# Patient Record
Sex: Female | Born: 1955 | Race: Black or African American | Hispanic: No | Smoking: Former smoker
Health system: Southern US, Community
[De-identification: ages and names within clinical notes are randomized; demographics above are authoritative.]

## PROBLEM LIST (undated history)

## (undated) DIAGNOSIS — E7800 Pure hypercholesterolemia, unspecified: Secondary | ICD-10-CM

## (undated) DIAGNOSIS — H269 Unspecified cataract: Secondary | ICD-10-CM

## (undated) DIAGNOSIS — M1990 Unspecified osteoarthritis, unspecified site: Secondary | ICD-10-CM

## (undated) DIAGNOSIS — G510 Bell's palsy: Secondary | ICD-10-CM

## (undated) DIAGNOSIS — I10 Essential (primary) hypertension: Secondary | ICD-10-CM

## (undated) HISTORY — DX: Unspecified cataract: H26.9

## (undated) HISTORY — PX: CHOLECYSTECTOMY: SHX55

## (undated) HISTORY — DX: Unspecified osteoarthritis, unspecified site: M19.90

---

## 2004-11-04 ENCOUNTER — Emergency Department (HOSPITAL_COMMUNITY): Admission: EM | Admit: 2004-11-04 | Discharge: 2004-11-04 | Payer: Self-pay | Admitting: *Deleted

## 2005-12-17 ENCOUNTER — Ambulatory Visit: Payer: Self-pay | Admitting: Nurse Practitioner

## 2007-01-21 ENCOUNTER — Ambulatory Visit: Payer: Self-pay | Admitting: *Deleted

## 2008-02-01 ENCOUNTER — Ambulatory Visit: Payer: Self-pay | Admitting: Family Medicine

## 2008-02-21 ENCOUNTER — Ambulatory Visit: Payer: Self-pay | Admitting: Gastroenterology

## 2009-04-18 ENCOUNTER — Ambulatory Visit: Payer: Self-pay

## 2011-03-20 ENCOUNTER — Ambulatory Visit: Payer: Self-pay | Admitting: Family Medicine

## 2011-04-07 ENCOUNTER — Emergency Department (HOSPITAL_COMMUNITY): Payer: No Typology Code available for payment source

## 2011-04-07 ENCOUNTER — Emergency Department (HOSPITAL_COMMUNITY)
Admission: EM | Admit: 2011-04-07 | Discharge: 2011-04-07 | Disposition: A | Discharge status: Home / Self Care | Payer: No Typology Code available for payment source | Attending: Emergency Medicine | Admitting: Emergency Medicine

## 2011-04-07 DIAGNOSIS — M545 Low back pain, unspecified: Secondary | ICD-10-CM | POA: Insufficient documentation

## 2011-04-07 DIAGNOSIS — Y92410 Unspecified street and highway as the place of occurrence of the external cause: Secondary | ICD-10-CM | POA: Insufficient documentation

## 2011-04-07 DIAGNOSIS — S39012A Strain of muscle, fascia and tendon of lower back, initial encounter: Secondary | ICD-10-CM

## 2011-04-07 DIAGNOSIS — R079 Chest pain, unspecified: Secondary | ICD-10-CM | POA: Insufficient documentation

## 2011-04-07 DIAGNOSIS — R209 Unspecified disturbances of skin sensation: Secondary | ICD-10-CM | POA: Insufficient documentation

## 2011-04-07 DIAGNOSIS — I10 Essential (primary) hypertension: Secondary | ICD-10-CM | POA: Insufficient documentation

## 2011-04-07 DIAGNOSIS — S335XXA Sprain of ligaments of lumbar spine, initial encounter: Secondary | ICD-10-CM | POA: Insufficient documentation

## 2011-04-07 HISTORY — DX: Essential (primary) hypertension: I10

## 2011-04-07 MED ORDER — NAPROXEN 500 MG PO TABS: 500.0000 mg | ORAL_TABLET | Freq: Two times a day (BID) | ORAL | Status: DC

## 2011-04-07 MED ORDER — HYDROCODONE-ACETAMINOPHEN 5-325 MG PO TABS: 1.0000 | ORAL_TABLET | Freq: Four times a day (QID) | ORAL | Status: AC | PRN
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
## 2011-04-07 NOTE — ED Notes (Signed)
Pt alert & oriented x4, stable gait. Pt given discharge instructions, paperwork & prescription(s), pt verbalized understanding. Pt left department w/ no further questions.  
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
## 2011-05-01 ENCOUNTER — Encounter: Payer: Self-pay | Admitting: Family Medicine

## 2011-05-02 ENCOUNTER — Encounter: Payer: Self-pay | Admitting: Family Medicine

## 2011-05-30 ENCOUNTER — Encounter: Payer: Self-pay | Admitting: Family Medicine

## 2011-06-30 ENCOUNTER — Encounter: Payer: Self-pay | Admitting: Family Medicine

## 2012-04-06 ENCOUNTER — Emergency Department (HOSPITAL_COMMUNITY)
Admission: EM | Admit: 2012-04-06 | Discharge: 2012-04-06 | Disposition: A | Discharge status: Home / Self Care | Payer: BC Managed Care – PPO | Attending: Emergency Medicine | Admitting: Emergency Medicine

## 2012-04-06 ENCOUNTER — Encounter (HOSPITAL_COMMUNITY): Payer: Self-pay | Admitting: *Deleted

## 2012-04-06 DIAGNOSIS — Z8669 Personal history of other diseases of the nervous system and sense organs: Secondary | ICD-10-CM | POA: Diagnosis not present

## 2012-04-06 DIAGNOSIS — I10 Essential (primary) hypertension: Secondary | ICD-10-CM | POA: Diagnosis not present

## 2012-04-06 DIAGNOSIS — R42 Dizziness and giddiness: Secondary | ICD-10-CM | POA: Diagnosis present

## 2012-04-06 DIAGNOSIS — Z79899 Other long term (current) drug therapy: Secondary | ICD-10-CM | POA: Insufficient documentation

## 2012-04-06 DIAGNOSIS — F17200 Nicotine dependence, unspecified, uncomplicated: Secondary | ICD-10-CM | POA: Diagnosis not present

## 2012-04-06 HISTORY — DX: Bell's palsy: G51.0

## 2012-04-06 LAB — CBC WITH DIFFERENTIAL/PLATELET
Eosinophils Absolute: 0.1 10*3/uL (ref 0.0–0.7)
Eosinophils Relative: 2 % (ref 0–5)
HCT: 38.8 % (ref 36.0–46.0)
Lymphocytes Relative: 59 % — ABNORMAL HIGH (ref 12–46)
Lymphs Abs: 2.5 10*3/uL (ref 0.7–4.0)
MCH: 29.1 pg (ref 26.0–34.0)
MCV: 88.8 fL (ref 78.0–100.0)
Monocytes Absolute: 0.4 10*3/uL (ref 0.1–1.0)
Monocytes Relative: 9 % (ref 3–12)
RBC: 4.37 MIL/uL (ref 3.87–5.11)
WBC: 4.2 10*3/uL (ref 4.0–10.5)

## 2012-04-06 LAB — BASIC METABOLIC PANEL
BUN: 11 mg/dL (ref 6–23)
CO2: 29 mEq/L (ref 19–32)
Calcium: 9.8 mg/dL (ref 8.4–10.5)
Creatinine, Ser: 0.6 mg/dL (ref 0.50–1.10)
GFR calc non Af Amer: >90 mL/min (ref >90)
Glucose, Bld: 94 mg/dL (ref 70–99)

## 2012-04-06 MED ORDER — MECLIZINE HCL 12.5 MG PO TABS
25.0000 mg | ORAL_TABLET | Freq: Once | ORAL | Status: AC
  Administered 2012-04-06: 25 mg via ORAL
  Filled 2012-04-06: qty 2

## 2012-04-06 MED ORDER — MECLIZINE HCL 50 MG PO TABS: 25.0000 mg | ORAL_TABLET | Freq: Three times a day (TID) | ORAL | Status: DC | PRN

## 2012-04-06 NOTE — ED Notes (Signed)
Pt c/o dizziness that comes and goes since Sunday, dizziness is associated with vision disturbances and nausea. Denies any headache, weakness to any extremities, speech clear, pt does have facial droop noted to right side of mouth, when asked pt states that is normal for her due to bells palsy since 1996. Pt does admits to feeling "stopped up" in the sinus area.

## 2012-04-06 NOTE — ED Provider Notes (Signed)
History   This chart was scribed for Celene Kras, MD by Donne Anon, ED Scribe. This patient was seen in room APA08/[facility]08 and the patient's care was started at 1735.   CSN: [id number]  Arrival date & time 04/06/12  1655   First MD Initiated Contact with Patient 04/06/12 1735      Chief Complaint  Patient presents with  . Dizziness     The history is provided by the patient. No language interpreter was used.   Amanda Doyle is a 57 y.o. female who presents to the Emergency Department complaining of sudden onset, intermittent dizziness which began 3 days ago and occurs while she is sitting. She reports multiple episodes a day with associated nausea that began yesterday, accompanied by a feeling of the room spinning. She denies ear ache, speech issues, emesis, diarrhea or any other pain. She has a history of HTN, but denies a history of DM.   Past Medical History  Diagnosis Date  . Hypertension   . Bell palsy     History reviewed. No pertinent past surgical history.  No family history on file.  History  Substance Use Topics  . Smoking status: Current Some Day Smoker    Types: Cigarettes  . Smokeless tobacco: Not on file  . Alcohol Use: Yes     Comment: occ    OB History    Grav Para Term Preterm Abortions TAB SAB Ect Mult Living                  Review of Systems  All other systems reviewed and are negative.  10 Systems reviewed and all are negative for acute change except as noted in the HPI.    Allergies  Shellfish allergy  Home Medications   Current Outpatient Rx  Name  Route  Sig  Dispense  Refill  . OMEGA-3 FATTY ACIDS 1000 MG PO CAPS   Oral   Take 1 g by mouth daily.           Marland Kitchen HYDROCHLOROTHIAZIDE 25 MG PO TABS   Oral   Take 25 mg by mouth daily.           Marland Kitchen NAPROXEN 500 MG PO TABS   Oral   Take 1 tablet (500 mg total) by mouth 2 (two) times daily.   14 tablet   0     Triage Vitals: BP 123/78  Pulse 76  Temp 97.1 F (36.2 C)   Resp 20  Ht 5\' 3"  (1.6 m)  Wt 185 lb (83.915 kg)  BMI 32.77 kg/m2  SpO2 98%  Physical Exam  Nursing note and vitals reviewed. Constitutional: She is oriented to person, place, and time. She appears well-developed and well-nourished. No distress.  HENT:  Head: Normocephalic and atraumatic.  Right Ear: External ear normal.  Left Ear: External ear normal.  Mouth/Throat: Oropharynx is clear and moist.  Eyes: Conjunctivae normal are normal. Right eye exhibits no discharge. Left eye exhibits no discharge. No scleral icterus.  Neck: Neck supple. No tracheal deviation present.  Cardiovascular: Normal rate, regular rhythm and intact distal pulses.   Pulmonary/Chest: Effort normal and breath sounds normal. No stridor. No respiratory distress. She has no wheezes. She has no rales.  Abdominal: Soft. Bowel sounds are normal. She exhibits no distension. There is no tenderness. There is no rebound and no guarding.  Musculoskeletal: She exhibits no edema and no tenderness.  Neurological: She is alert and oriented to person, place, and time. She  has normal strength. A cranial nerve deficit (peripheral right facial palsy) is present. No sensory deficit. She exhibits normal muscle tone. She displays no seizure activity. Coordination normal.       No pronator drift bilateral upper extrem, able to hold both legs off bed for 5 seconds, sensation intact in all extremities, no visual field cuts, no left or right sided neglect  Skin: Skin is warm and dry. No rash noted.  Psychiatric: She has a normal mood and affect.    ED Course  Procedures (including critical care time)   DIAGNOSTIC STUDIES: Oxygen Saturation is 98% on room air, normal by my interpretation.   Normal sinus rhythm rate 67 normal intervals rate QRS.  COORDINATION OF CARE: 5:42 PM Discussed treatment plan which includes CT scans with pt at bedside and pt agreed to plan.   Labs Reviewed  CBC WITH DIFFERENTIAL - Abnormal; Notable for the  following:    Neutrophils Relative 30 (*)     Neutro Abs 1.3 (*)     Lymphocytes Relative 59 (*)     All other components within normal limits  BASIC METABOLIC PANEL   No results found.  1. Dizziness     MDM   Doubt stroke, TIA.  Nl gait.  Nl exam.  Suspect peripheral vertigo  I personally performed the services described in this documentation, which was scribed in my presence. The recorded information has been reviewed and is accurate.         Celene Kras, MD 04/07/12 [phone]

## 2012-10-14 ENCOUNTER — Ambulatory Visit: Payer: Self-pay

## 2013-12-19 ENCOUNTER — Ambulatory Visit: Payer: Self-pay | Admitting: Internal Medicine

## 2014-08-07 ENCOUNTER — Emergency Department (HOSPITAL_COMMUNITY)
Admission: EM | Admit: 2014-08-07 | Discharge: 2014-08-07 | Disposition: A | Discharge status: Home / Self Care | Payer: Worker's Compensation | Attending: Emergency Medicine | Admitting: Emergency Medicine

## 2014-08-07 ENCOUNTER — Encounter (HOSPITAL_COMMUNITY): Payer: Self-pay | Admitting: *Deleted

## 2014-08-07 ENCOUNTER — Emergency Department (HOSPITAL_COMMUNITY): Payer: Worker's Compensation

## 2014-08-07 DIAGNOSIS — Y998 Other external cause status: Secondary | ICD-10-CM | POA: Diagnosis not present

## 2014-08-07 DIAGNOSIS — I10 Essential (primary) hypertension: Secondary | ICD-10-CM | POA: Diagnosis not present

## 2014-08-07 DIAGNOSIS — Z720 Tobacco use: Secondary | ICD-10-CM | POA: Insufficient documentation

## 2014-08-07 DIAGNOSIS — Z8669 Personal history of other diseases of the nervous system and sense organs: Secondary | ICD-10-CM | POA: Insufficient documentation

## 2014-08-07 DIAGNOSIS — S0990XA Unspecified injury of head, initial encounter: Secondary | ICD-10-CM | POA: Diagnosis not present

## 2014-08-07 DIAGNOSIS — S8391XA Sprain of unspecified site of right knee, initial encounter: Secondary | ICD-10-CM | POA: Insufficient documentation

## 2014-08-07 DIAGNOSIS — Y92410 Unspecified street and highway as the place of occurrence of the external cause: Secondary | ICD-10-CM | POA: Diagnosis not present

## 2014-08-07 DIAGNOSIS — Z8639 Personal history of other endocrine, nutritional and metabolic disease: Secondary | ICD-10-CM | POA: Insufficient documentation

## 2014-08-07 DIAGNOSIS — Y9389 Activity, other specified: Secondary | ICD-10-CM | POA: Insufficient documentation

## 2014-08-07 DIAGNOSIS — Z79899 Other long term (current) drug therapy: Secondary | ICD-10-CM | POA: Diagnosis not present

## 2014-08-07 DIAGNOSIS — S8992XA Unspecified injury of left lower leg, initial encounter: Secondary | ICD-10-CM | POA: Diagnosis present

## 2014-08-07 HISTORY — DX: Pure hypercholesterolemia, unspecified: E78.00

## 2014-08-07 MED ORDER — METHOCARBAMOL 500 MG PO TABS
500.0000 mg | ORAL_TABLET | Freq: Once | ORAL | Status: AC
  Administered 2014-08-07: 500 mg via ORAL
  Filled 2014-08-07: qty 1

## 2014-08-07 MED ORDER — METHOCARBAMOL 500 MG PO TABS: 500.0000 mg | ORAL_TABLET | Freq: Three times a day (TID) | ORAL | Status: DC

## 2014-08-07 MED ORDER — HYDROCODONE-ACETAMINOPHEN 5-325 MG PO TABS: ORAL_TABLET | ORAL | Status: DC

## 2014-08-07 MED ORDER — HYDROCODONE-ACETAMINOPHEN 5-325 MG PO TABS
1.0000 | ORAL_TABLET | Freq: Once | ORAL | Status: AC
  Administered 2014-08-07: 1 via ORAL
  Filled 2014-08-07: qty 1
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
## 2014-08-07 NOTE — Discharge Instructions (Signed)
Knee Pain °Knee pain can be a result of an injury or other medical conditions. Treatment will depend on the cause of your pain. °HOME CARE °· Only take medicine as told by your doctor. °· Keep a healthy weight. Being overweight can make the knee hurt more. °· Stretch before exercising or playing sports. °· If there is constant knee pain, change the way you exercise. Ask your doctor for advice. °· Make sure shoes fit well. Choose the right shoe for the sport or activity. °· Protect your knees. Wear kneepads if needed. °· Rest when you are tired. °GET HELP RIGHT AWAY IF:  °· Your knee pain does not stop. °· Your knee pain does not get better. °· Your knee joint feels hot to the touch. °· You have a fever. °MAKE SURE YOU:  °· Understand these instructions. °· Will watch this condition. °· Will get help right away if you are not doing well or get worse. °Document Released: 06/13/2008 Document Revised: 06/09/2011 Document Reviewed: 06/13/2008 °[employer]® Patient Information ©2015 [employer]. This information is not intended to replace advice given to you by your health care provider. Make sure you discuss any questions you have with your health care provider. ° °
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
## 2015-05-15 ENCOUNTER — Other Ambulatory Visit: Payer: Self-pay | Admitting: Family Medicine

## 2015-05-15 DIAGNOSIS — Z1231 Encounter for screening mammogram for malignant neoplasm of breast: Secondary | ICD-10-CM

## 2015-05-21 ENCOUNTER — Ambulatory Visit
Admission: RE | Admit: 2015-05-21 | Discharge: 2015-05-21 | Disposition: A | Discharge status: Home / Self Care | Payer: PRIVATE HEALTH INSURANCE | Source: Ambulatory Visit | Attending: Family Medicine | Admitting: Family Medicine

## 2015-05-21 DIAGNOSIS — Z1231 Encounter for screening mammogram for malignant neoplasm of breast: Secondary | ICD-10-CM | POA: Diagnosis present

## 2016-05-20 ENCOUNTER — Other Ambulatory Visit: Payer: Self-pay | Admitting: Family Medicine

## 2016-05-20 DIAGNOSIS — Z1231 Encounter for screening mammogram for malignant neoplasm of breast: Secondary | ICD-10-CM

## 2016-05-20 DIAGNOSIS — R928 Other abnormal and inconclusive findings on diagnostic imaging of breast: Secondary | ICD-10-CM

## 2016-06-17 ENCOUNTER — Ambulatory Visit
Admission: RE | Admit: 2016-06-17 | Discharge: 2016-06-17 | Disposition: A | Discharge status: Home / Self Care | Payer: PRIVATE HEALTH INSURANCE | Source: Ambulatory Visit | Attending: Family Medicine | Admitting: Family Medicine

## 2016-06-17 DIAGNOSIS — Z1231 Encounter for screening mammogram for malignant neoplasm of breast: Secondary | ICD-10-CM | POA: Diagnosis present

## 2018-02-18 ENCOUNTER — Encounter: Payer: Self-pay | Admitting: Family Medicine

## 2018-06-18 ENCOUNTER — Encounter: Payer: Self-pay | Admitting: Gastroenterology

## 2019-09-28 ENCOUNTER — Emergency Department (HOSPITAL_COMMUNITY)
Admission: EM | Admit: 2019-09-28 | Discharge: 2019-09-28 | Disposition: A | Discharge status: Home / Self Care | Payer: PRIVATE HEALTH INSURANCE | Attending: Emergency Medicine | Admitting: Emergency Medicine

## 2019-09-28 ENCOUNTER — Encounter (HOSPITAL_COMMUNITY): Payer: Self-pay

## 2019-09-28 ENCOUNTER — Other Ambulatory Visit: Payer: Self-pay

## 2019-09-28 ENCOUNTER — Emergency Department (HOSPITAL_COMMUNITY): Payer: PRIVATE HEALTH INSURANCE

## 2019-09-28 DIAGNOSIS — Z79899 Other long term (current) drug therapy: Secondary | ICD-10-CM | POA: Insufficient documentation

## 2019-09-28 DIAGNOSIS — F17210 Nicotine dependence, cigarettes, uncomplicated: Secondary | ICD-10-CM | POA: Diagnosis not present

## 2019-09-28 DIAGNOSIS — M546 Pain in thoracic spine: Secondary | ICD-10-CM

## 2019-09-28 DIAGNOSIS — I10 Essential (primary) hypertension: Secondary | ICD-10-CM | POA: Insufficient documentation

## 2019-09-28 LAB — BASIC METABOLIC PANEL
Anion gap: 12 (ref 5–15)
BUN: 9 mg/dL (ref 8–23)
CO2: 24 mmol/L (ref 22–32)
Calcium: 9.5 mg/dL (ref 8.9–10.3)
Chloride: 103 mmol/L (ref 98–111)
Creatinine, Ser: 0.61 mg/dL (ref 0.44–1.00)
GFR calc Af Amer: >60 mL/min (ref >60)
GFR calc non Af Amer: >60 mL/min (ref >60)
Glucose, Bld: 104 mg/dL — ABNORMAL HIGH (ref 70–99)
Potassium: 3.6 mmol/L (ref 3.5–5.1)
Sodium: 139 mmol/L (ref 135–145)

## 2019-09-28 LAB — CBC
HCT: 41.6 % (ref 36.0–46.0)
Hemoglobin: 12.8 g/dL (ref 12.0–15.0)
MCH: 27.2 pg (ref 26.0–34.0)
MCHC: 30.8 g/dL (ref 30.0–36.0)
MCV: 88.5 fL (ref 80.0–100.0)
Platelets: 230 10*3/uL (ref 150–400)
RBC: 4.7 MIL/uL (ref 3.87–5.11)
RDW: 14.6 % (ref 11.5–15.5)
WBC: 4.5 10*3/uL (ref 4.0–10.5)
nRBC: 0 % (ref 0.0–0.2)

## 2019-09-28 LAB — TROPONIN I (HIGH SENSITIVITY)
Troponin I (High Sensitivity): 3 ng/L (ref <18)
Troponin I (High Sensitivity): 3 ng/L (ref <18)

## 2019-09-28 LAB — HEPATIC FUNCTION PANEL
ALT: 19 U/L (ref 0–44)
AST: 19 U/L (ref 15–41)
Albumin: 4.3 g/dL (ref 3.5–5.0)
Alkaline Phosphatase: 98 U/L (ref 38–126)
Bilirubin, Direct: 0.1 mg/dL (ref 0.0–0.2)
Indirect Bilirubin: 0.4 mg/dL (ref 0.3–0.9)
Total Bilirubin: 0.5 mg/dL (ref 0.3–1.2)
Total Protein: 7.2 g/dL (ref 6.5–8.1)

## 2019-09-28 LAB — LIPASE, BLOOD: Lipase: 23 U/L (ref 11–51)

## 2019-09-28 MED ORDER — IOHEXOL 350 MG/ML SOLN
100.0000 mL | Freq: Once | INTRAVENOUS | Status: AC | PRN
  Administered 2019-09-28: 100 mL via INTRAVENOUS

## 2019-09-28 MED ORDER — FENTANYL CITRATE (PF) 100 MCG/2ML IJ SOLN
50.0000 ug | INTRAMUSCULAR | Status: DC | PRN
  Administered 2019-09-28: 50 ug via INTRAVENOUS
  Filled 2019-09-28: qty 2

## 2019-09-28 MED ORDER — ACETAMINOPHEN 325 MG PO TABS
650.0000 mg | ORAL_TABLET | Freq: Once | ORAL | Status: AC
  Administered 2019-09-28: 650 mg via ORAL
  Filled 2019-09-28: qty 2

## 2019-09-28 NOTE — ED Triage Notes (Signed)
Pt reports pain between shoulder blades since around 0600 this morning.  Says thought was gas so she drank a coke. Says it just made her nauseated.  Reports movement doesn't make it better or worse.   Rpeorts pain has started radiating around under r breast.

## 2019-09-28 NOTE — ED Notes (Signed)
Pt in bed, pt back from ct, pt reports a slight decrease in her pain, states that her pain is still a 7/10, pt requests some more pain med.

## 2019-09-28 NOTE — ED Notes (Signed)
Advised patient we needed urine specimen.  Patient stated she had just gone to the restroom.  Advised patient to let us know next time she needed to go to the bathroom.

## 2019-09-28 NOTE — Discharge Instructions (Addendum)
return to the emergency room for worsening or new symptoms.Follow-up closely with your primary care doctor,. Take Tylenol every 4 hours as needed for pain.  Use ice as needed.

## 2019-09-28 NOTE — ED Provider Notes (Signed)
[facility] EMERGENCY DEPARTMENT Provider Note   CSN: [id number] Arrival date & time: 09/28/19  7616     History Chief Complaint  Patient presents with  . Back Pain    Amanda Doyle is a 64 y.o. female.  Patient has a history of high blood pressure high cholesterol presents with severe pain between her shoulder blades and mild right upper flank pain without injury recalled.  No fevers chills or shortness of breath.  No respiratory symptoms.  No history of similar.  No history of cancer.  Mild nausea.        Past Medical History:  Diagnosis Date  . Bell palsy   . Hypercholesteremia   . Hypertension     There are no problems to display for this patient.   History reviewed. No pertinent surgical history.   OB History   No obstetric history on file.     Family History  Problem Relation Age of Onset  . Breast cancer Paternal Aunt        3 aunts, ages 83, 21, 40    Social History   Tobacco Use  . Smoking status: Current Some Day Smoker    Types: Cigarettes  . Smokeless tobacco: Never Used  Substance Use Topics  . Alcohol use: Yes    Comment: occ  . Drug use: No    Home Medications Prior to Admission medications   Medication Sig Start Date End Date Taking? Authorizing Provider  amLODipine (NORVASC) 10 MG tablet Take 10 mg by mouth daily. 06/28/19  Yes [provider]  hydrochlorothiazide (HYDRODIURIL) 25 MG tablet Take 25 mg by mouth every morning.    Yes [provider]  pravastatin (PRAVACHOL) 40 MG tablet Take 40 mg by mouth at bedtime. 06/28/19  Yes [provider]    Allergies    Shellfish allergy  Review of Systems   Review of Systems  Constitutional: Positive for appetite change. Negative for chills and fever.  HENT: Negative for congestion.   Eyes: Negative for visual disturbance.  Respiratory: Negative for shortness of breath.   Cardiovascular: Negative for chest pain.  Gastrointestinal: Negative for abdominal  pain and vomiting.  Genitourinary: Positive for flank pain. Negative for dysuria.  Musculoskeletal: Positive for back pain. Negative for neck pain and neck stiffness.  Skin: Negative for rash.  Neurological: Negative for light-headedness and headaches.    Physical Exam Updated Vital Signs BP 127/72 (BP Location: Right Arm)   Pulse 69   Temp 98 F (36.7 C) (Oral)   Resp 17   Ht 5\' 3"  (1.6 m)   Wt 88.5 kg   SpO2 100%   BMI 34.54 kg/m   Physical Exam Vitals and nursing note reviewed.  Constitutional:      Appearance: She is well-developed.  HENT:     Head: Normocephalic and atraumatic.  Eyes:     General:        Right eye: No discharge.        Left eye: No discharge.     Conjunctiva/sclera: Conjunctivae normal.  Neck:     Trachea: No tracheal deviation.  Cardiovascular:     Rate and Rhythm: Normal rate and regular rhythm.  Pulmonary:     Effort: Pulmonary effort is normal.     Breath sounds: Normal breath sounds.  Abdominal:     General: There is no distension.     Palpations: Abdomen is soft.     Tenderness: There is no abdominal tenderness. There is no guarding.  Musculoskeletal:        General: Tenderness (thoracic paraspinal) present. No swelling. Normal range of motion.     Cervical back: Normal range of motion and neck supple.  Skin:    General: Skin is warm.     Findings: No rash.  Neurological:     General: No focal deficit present.     Mental Status: She is alert and oriented to person, place, and time.     Cranial Nerves: No cranial nerve deficit.     Comments: Patient has 5+ strength upper and lower extremities equal bilateral, sensation intact in lower extremities.  Psychiatric:        Mood and Affect: Mood normal.     ED Results / Procedures / Treatments   Labs (all labs ordered are listed, but only abnormal results are displayed) Labs Reviewed  BASIC METABOLIC PANEL - Abnormal; Notable for the following components:      Result Value    Glucose, Bld 104 (*)    All other components within normal limits  CBC  LIPASE, BLOOD  HEPATIC FUNCTION PANEL  URINALYSIS, ROUTINE W REFLEX MICROSCOPIC  TROPONIN I (HIGH SENSITIVITY)  TROPONIN I (HIGH SENSITIVITY)    EKG EKG Interpretation  Date/Time:  Wednesday September 28 2019 09:42:54 EDT Ventricular Rate:  77 PR Interval:  148 QRS Duration: 74 QT Interval:  366 QTC Calculation: 414 R Axis:   51 Text Interpretation: Normal sinus rhythm Normal ECG Confirmed by Blane Ohara [phone]) on 09/28/2019 11:39:55 AM   Radiology CT Angio Chest/Abd/Pel for Dissection W and/or Wo Contrast  Result Date: 09/28/2019 CLINICAL DATA:  Back pain EXAM: CT ANGIOGRAPHY CHEST, ABDOMEN AND PELVIS TECHNIQUE: Non-contrast CT of the chest was initially obtained. Multidetector CT imaging through the chest, abdomen and pelvis was performed using the standard protocol during bolus administration of intravenous contrast. Multiplanar reconstructed images and MIPs were obtained and reviewed to evaluate the vascular anatomy. CONTRAST:  OMNIPAQUE IOHEXOL 350 MG/ML SOLN COMPARISON:  None. FINDINGS: CTA CHEST FINDINGS Cardiovascular: There is no evidence of aortic intramural hematoma or dissection. The thoracic aorta is normal in caliber. Normal heart size. No pericardial effusion. Mediastinum/Nodes: There are no enlarged lymph nodes. Thyroid is unremarkable. Lungs/Pleura: Mild bibasilar scarring/atelectasis. Musculoskeletal: No acute osseous abnormality. Review of the MIP images confirms the above findings. CTA ABDOMEN AND PELVIS FINDINGS VASCULAR Aorta: Normal caliber aorta without aneurysm, dissection, vasculitis or significant stenosis. Mild calcified plaque. Celiac: Patent without stenosis. SMA: Patent without stenosis Renals: Patent with minimal calcified plaque at the origins. IMA: Patent without stenosis. Inflow: Patent without stenosis. Veins: Poorly evaluated on this study. Review of the MIP images confirms the  above findings. NON-VASCULAR Hepatobiliary: Hepatic steatosis.  Gallbladder is unremarkable. Pancreas: Unremarkable. Spleen: Unremarkable. Adrenals/Urinary Tract: Adrenals, kidneys, and bladder are unremarkable. Stomach/Bowel: Stomach is within normal limits. Bowel is normal in caliber. Mild sigmoid diverticulosis. Normal appendix. Lymphatic: No enlarged lymph nodes identified. Reproductive: Uterus and bilateral adnexa are unremarkable. Other: No ascites. No significant abnormality of the abdominal wall. Musculoskeletal: No acute osseous abnormality. Review of the MIP images confirms the above findings. IMPRESSION: No evidence of aortic dissection or other acute abnormality. Hepatic steatosis. Mild colonic diverticulosis. Mild aortic atherosclerosis. Electronically Signed   By: Guadlupe Spanish M.D.   On: 09/28/2019 12:26    Procedures Procedures (including critical care time)  Medications Ordered in ED Medications  fentaNYL (SUBLIMAZE) injection 50 mcg (50 mcg Intravenous Given 09/28/19 1146)  iohexol (OMNIPAQUE) 350 MG/ML injection 100 mL (100 mLs  Intravenous Contrast Given 09/28/19 1152)  acetaminophen (TYLENOL) tablet 650 mg (650 mg Oral Given 09/28/19 1318)    ED Course  I have reviewed the triage vital signs and the nursing notes.  Pertinent labs & imaging results that were available during my care of the patient were reviewed by me and considered in my medical decision making (see chart for details).    MDM Rules/Calculators/A&P                          Patient presents with worsening thoracic back pain without injury.  Differential diagnosis includes musculoskeletal, GB. thoracic aortic dissection, reflux, pancreatitis, cancer, other.  Plan for blood work was ordered and reviewed normal white blood cell count, normal hemoglobin.  Chest x-ray no acute abnormalities.  EKG normal sinus rhythm no acute ischemic findings.  Troponin negative.  CT angiogram pending.  Pain meds ordered.  Patient  improved significantly on reassessment well-appearing pain controlled. CT scan results reviewed and no acute abnormalities, no dissection. Patient is doing well neurologically no indication for emergent MRI however discussed reasons return outpatient follow-up with primary doctor. Patient has no urinary symptoms and with midline thoracic back pain unlikely UTI patient comfortable following up with Dr. Regarding the urine if needed.   Final Clinical Impression(s) / ED Diagnoses Final diagnoses:  Acute bilateral thoracic back pain    Rx / DC Orders ED Discharge Orders    None       Blane Ohara, MD 09/28/19 1329

## 2020-02-01 ENCOUNTER — Encounter: Payer: Self-pay | Admitting: Gastroenterology

## 2020-03-02 ENCOUNTER — Other Ambulatory Visit: Payer: Self-pay

## 2020-03-02 ENCOUNTER — Ambulatory Visit (AMBULATORY_SURGERY_CENTER): Payer: Self-pay | Admitting: *Deleted

## 2020-03-02 VITALS — Ht 63.0 in | Wt 191.0 lb

## 2020-03-02 DIAGNOSIS — Z1211 Encounter for screening for malignant neoplasm of colon: Secondary | ICD-10-CM

## 2020-03-02 MED ORDER — PLENVU 140 G PO SOLR: 1.0000 | Freq: Once | ORAL | 0 refills | Status: AC

## 2020-03-02 NOTE — Progress Notes (Signed)

## 2020-03-14 ENCOUNTER — Encounter: Payer: Self-pay | Admitting: Gastroenterology

## 2020-03-14 ENCOUNTER — Ambulatory Visit (AMBULATORY_SURGERY_CENTER): Payer: PRIVATE HEALTH INSURANCE | Admitting: Gastroenterology

## 2020-03-14 ENCOUNTER — Other Ambulatory Visit: Payer: Self-pay

## 2020-03-14 VITALS — BP 123/71 | HR 74 | Temp 97.1°F | Resp 18 | Ht 63.0 in | Wt 191.0 lb

## 2020-03-14 DIAGNOSIS — Z1211 Encounter for screening for malignant neoplasm of colon: Secondary | ICD-10-CM | POA: Diagnosis not present

## 2020-03-14 NOTE — Op Note (Signed)
[facility] Patient Name: Amanda Doyle Procedure Date: 03/14/2020 9:55 AM MRN: [id number] Endoscopist: Sherilyn Cooter L. Myrtie Neither , MD Age: 64 Referring MD:  Date of Birth: [birth year] Gender: Female Account #: [id number] Procedure:                Colonoscopy Indications:              Screening for colorectal malignant neoplasm                            (patient reports normal colonoscopy near age 28) Medicines:                Monitored Anesthesia Care Procedure:                Pre-Anesthesia Assessment:                           - Prior to the procedure, a History and Physical                            was performed, and patient medications and                            allergies were reviewed. The patient's tolerance of                            previous anesthesia was also reviewed. The risks                            and benefits of the procedure and the sedation                            options and risks were discussed with the patient.                            All questions were answered, and informed consent                            was obtained. Prior Anticoagulants: The patient has                            taken no previous anticoagulant or antiplatelet                            agents. ASA Grade Assessment: II - A patient with                            mild systemic disease. After reviewing the risks                            and benefits, the patient was deemed in                            satisfactory condition to undergo the procedure.  After obtaining informed consent, the colonoscope                            was passed under direct vision. Throughout the                            procedure, the patient's blood pressure, pulse, and                            oxygen saturations were monitored continuously. The                            Olympus PCF-[id number] (#[id number]) Colonoscope was                            introduced through  the anus and advanced to the the                            terminal ileum, with identification of the                            appendiceal orifice and IC valve. The colonoscopy                            was somewhat difficult due to multiple diverticula                            in the colon. The patient tolerated the procedure                            well. The quality of the bowel preparation was                            good. The ileocecal valve, appendiceal orifice, and                            rectum were photographed. Scope In: 10:12:38 AM Scope Out: 10:28:13 AM Scope Withdrawal Time: 0 hours 11 minutes 21 seconds  Total Procedure Duration: 0 hours 15 minutes 35 seconds  Findings:                 The perianal and digital rectal examinations were                            normal.                           Many diverticula were found in the left colon and                            right colon.                           The exam was otherwise without abnormality on  direct and retroflexion views. Complications:            No immediate complications. Estimated Blood Loss:     Estimated blood loss: none. Impression:               - Diverticulosis in the left colon and in the right                            colon.                           - The examination was otherwise normal on direct                            and retroflexion views.                           - No specimens collected. Recommendation:           - Patient has a contact number available for                            emergencies. The signs and symptoms of potential                            delayed complications were discussed with the                            patient. Return to normal activities tomorrow.                            Written discharge instructions were provided to the                            patient.                           - Resume previous diet.                            - Continue present medications.                           - Repeat colonoscopy in 10 years for screening                            purposes. Erick Oxendine L. Myrtie Neither, MD 03/14/2020 10:35:49 AM This report has been signed electronically.

## 2020-03-14 NOTE — Patient Instructions (Signed)
YOU HAD AN ENDOSCOPIC PROCEDURE TODAY AT THE [facility]:   Refer to the procedure report that was given to you for any specific questions about what was found during the examination.  If the procedure report does not answer your questions, please call your gastroenterologist to clarify.  If you requested that your care partner not be given the details of your procedure findings, then the procedure report has been included in a sealed envelope for you to review at your convenience later.  YOU SHOULD EXPECT: Some feelings of bloating in the abdomen. Passage of more gas than usual.  Walking can help get rid of the air that was put into your GI tract during the procedure and reduce the bloating. If you had a lower endoscopy (such as a colonoscopy or flexible sigmoidoscopy) you may notice spotting of blood in your stool or on the toilet paper. If you underwent a bowel prep for your procedure, you may not have a normal bowel movement for a few days.  Please Note:  You might notice some irritation and congestion in your nose or some drainage.  This is from the oxygen used during your procedure.  There is no need for concern and it should clear up in a day or so.  SYMPTOMS TO REPORT IMMEDIATELY:   Following lower endoscopy (colonoscopy or flexible sigmoidoscopy):  Excessive amounts of blood in the stool  Significant tenderness or worsening of abdominal pains  Swelling of the abdomen that is new, acute  Fever of 100F or higher  For urgent or emergent issues, a gastroenterologist can be reached at any hour by calling [phone]. Do not use MyChart messaging for urgent concerns.    DIET:  We do recommend a small meal at first, but then you may proceed to your regular diet.  Drink plenty of fluids but you should avoid alcoholic beverages for 24 hours.  ACTIVITY:  You should plan to take it easy for the rest of today and you should NOT DRIVE or use heavy machinery until tomorrow (because  of the sedation medicines used during the test).    FOLLOW UP: Our staff will call the number listed on your records 48-72 hours following your procedure to check on you and address any questions or concerns that you may have regarding the information given to you following your procedure. If we do not reach you, we will leave a message.  We will attempt to reach you two times.  During this call, we will ask if you have developed any symptoms of COVID 19. If you develop any symptoms (ie: fever, flu-like symptoms, shortness of breath, cough etc.) before then, please call [phone].  If you test positive for Covid 19 in the 2 weeks post procedure, please call and report this information to us.    If any biopsies were taken you will be contacted by phone or by letter within the next 1-3 weeks.  Please call us at [phone] if you have not heard about the biopsies in 3 weeks.    SIGNATURES/CONFIDENTIALITY: You and/or your care partner have signed paperwork which will be entered into your electronic medical record.  These signatures attest to the fact that that the information above on your After Visit Summary has been reviewed and is understood.  Full responsibility of the confidentiality of this discharge information lies with you and/or your care-partner. 

## 2020-03-14 NOTE — Progress Notes (Signed)
Pt's states no medical or surgical changes since previsit or office visit. 

## 2020-03-14 NOTE — Progress Notes (Signed)
VS taken by C.W. 

## 2020-03-14 NOTE — Progress Notes (Signed)
To pacu vss. Report to [email or web address].tb 

## 2020-03-14 NOTE — Progress Notes (Signed)
Patient being admitted for colonoscopy, states she "forgot" and ate a sweet potato and rib last evening at 1830. Patient states she has had clear bm the last 2-3 times she has used the bathroom. Pt. Informed that if she is not clear, she may have to return for a repeat colonoscopy in the near future. Dr. Myrtie Neither notified, states if patient states she is clear and is aware she may have to return  If not clear, he will proceed. Patient verbalizes understanding and would like to proceed.

## 2020-03-16 ENCOUNTER — Telehealth: Payer: Self-pay

## 2020-03-16 NOTE — Telephone Encounter (Signed)
Some one answered but did not response times 2.

## 2020-03-16 NOTE — Telephone Encounter (Signed)
Called #[phone] and left a message we tried to reach pt for a follow up call. maw

## 2020-09-21 IMAGING — CT CT ANGIO CHEST-ABD-PELV FOR DISSECTION W/ AND WO/W CM
2 of 7 series · 15 of 46 positions shown, 17 images · non-contrast
Comparison: None.

CLINICAL DATA: Back pain

EXAM:
CT ANGIOGRAPHY CHEST, ABDOMEN AND PELVIS
TECHNIQUE: Non-contrast CT of the chest was initially obtained.

[Series 5: axial arterial · axial · arterial · 0.69mm/px · z∈[+1028,+1544]mm · 12 of 202 slices shown, 14 images]
[im 15/202  soft-tissue]
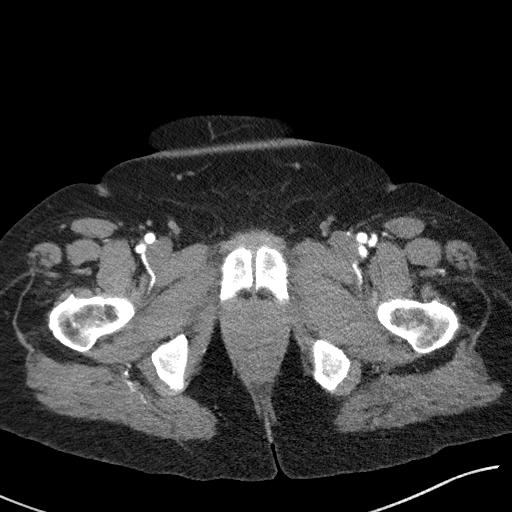
[im 15/202  bone]
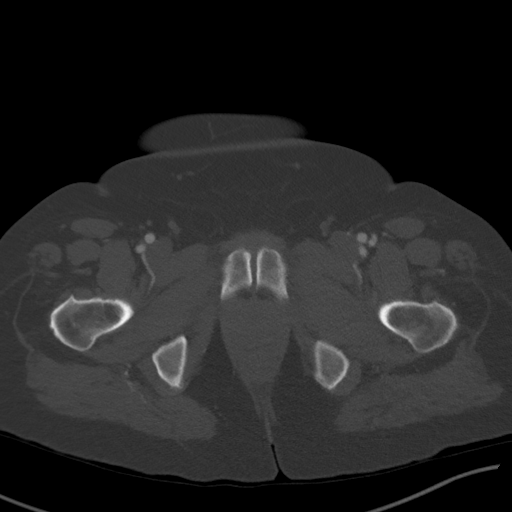
[im 29/202  soft-tissue]
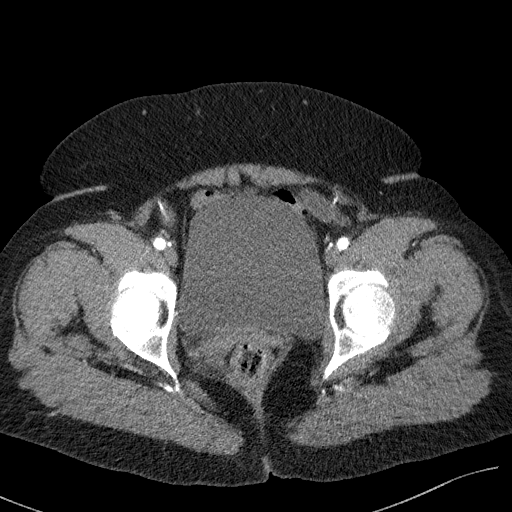
[im 44/202  soft-tissue]
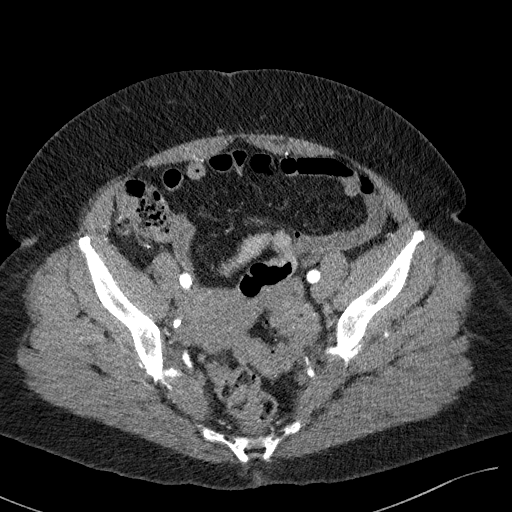
[im 58/202  soft-tissue]
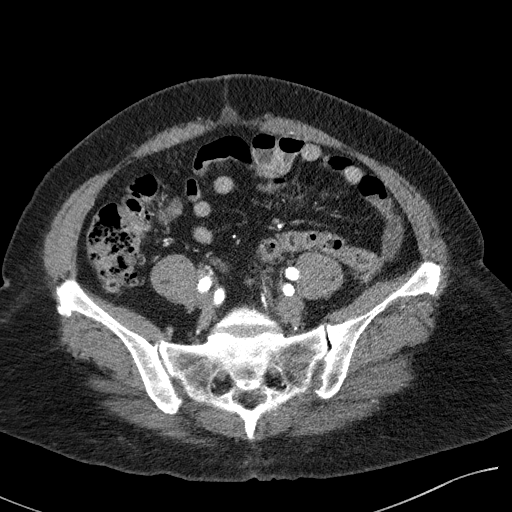
[im 72/202  soft-tissue]
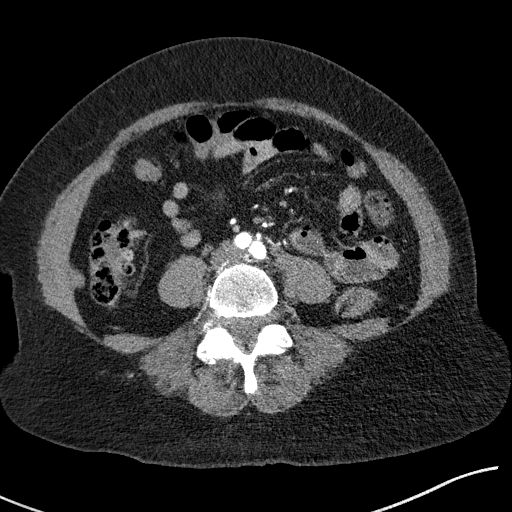
[im 87/202  soft-tissue]
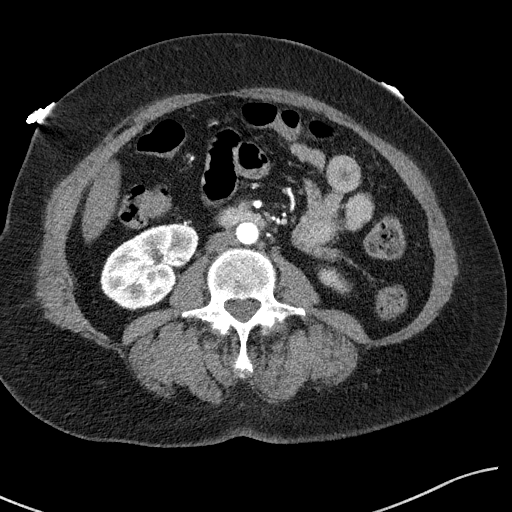
[im 115/202  soft-tissue]
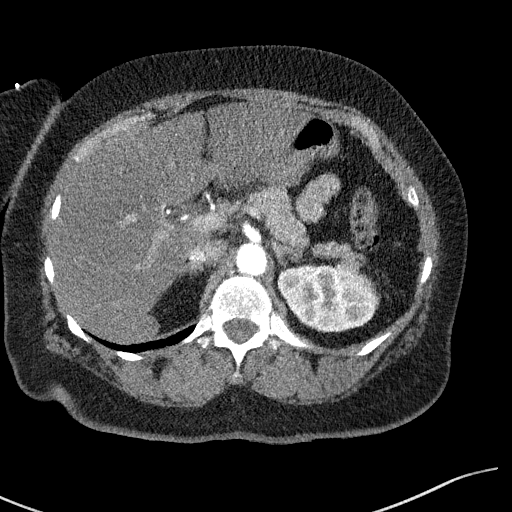
[im 130/202  soft-tissue]
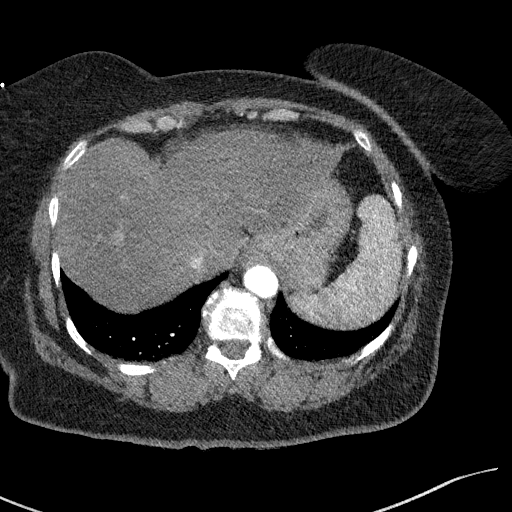
[im 144/202  soft-tissue]
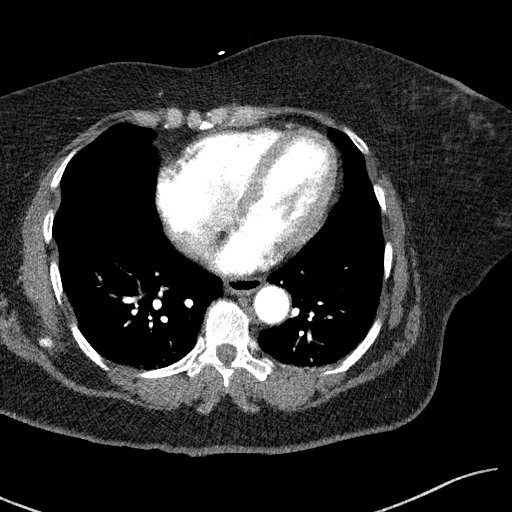
[im 144/202  bone]
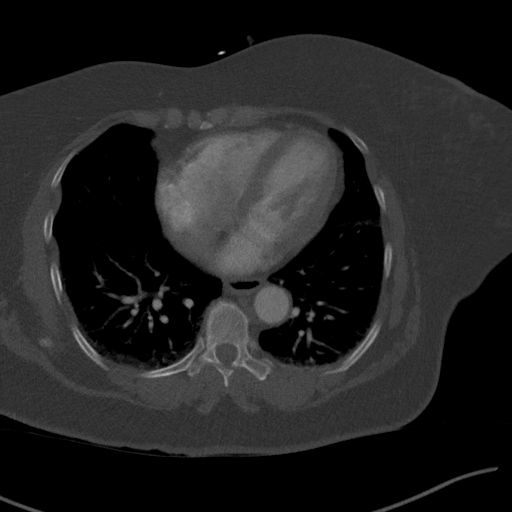
[im 158/202  soft-tissue]
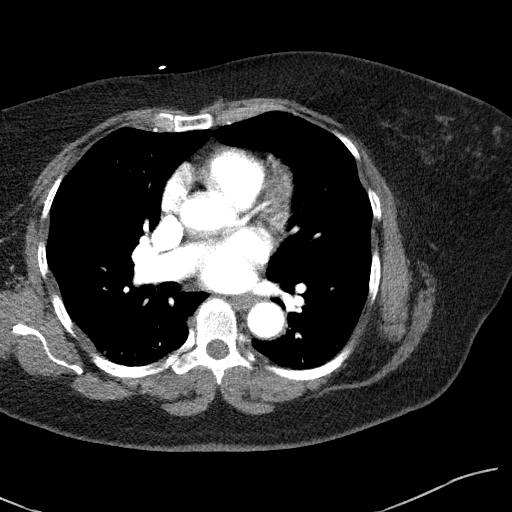
[im 173/202  soft-tissue]
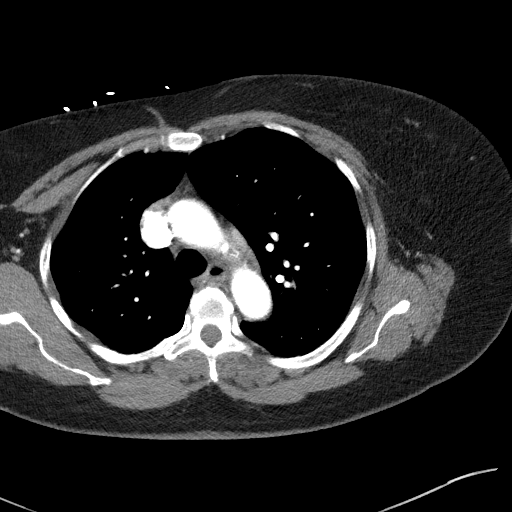
[im 187/202  soft-tissue]
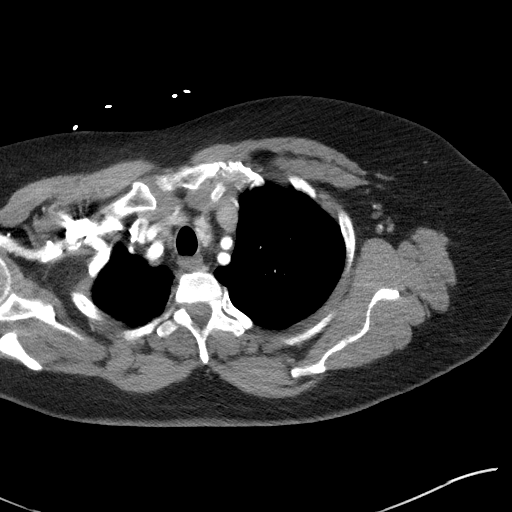

[Series 9: cor soft · coronal · 0.77mm/px · 3 of 135 slices shown]
[im 34/135  soft-tissue]
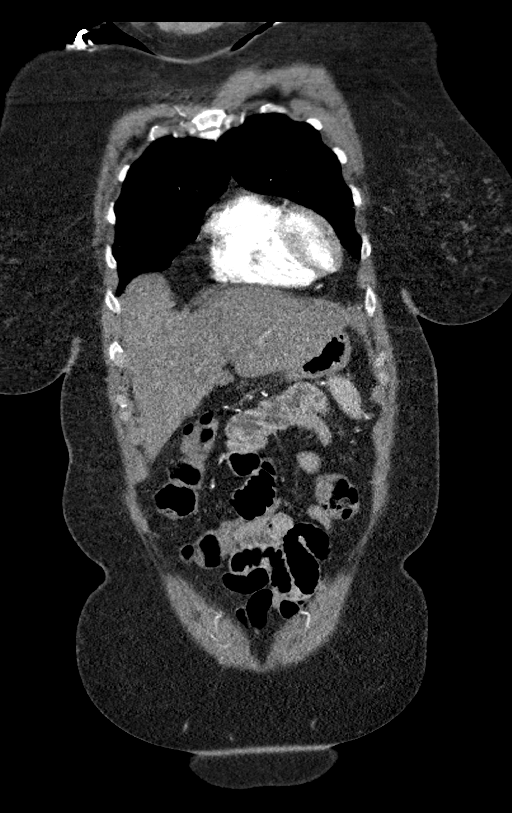
[im 68/135  soft-tissue]
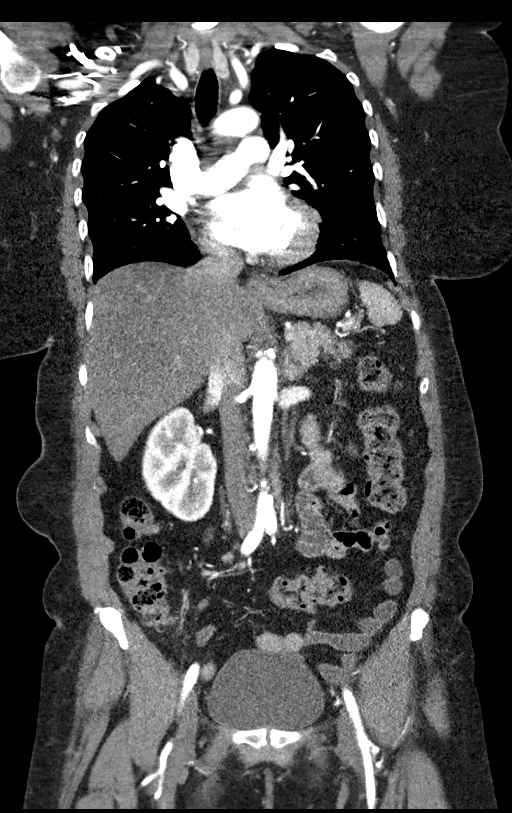
[im 101/135  soft-tissue]
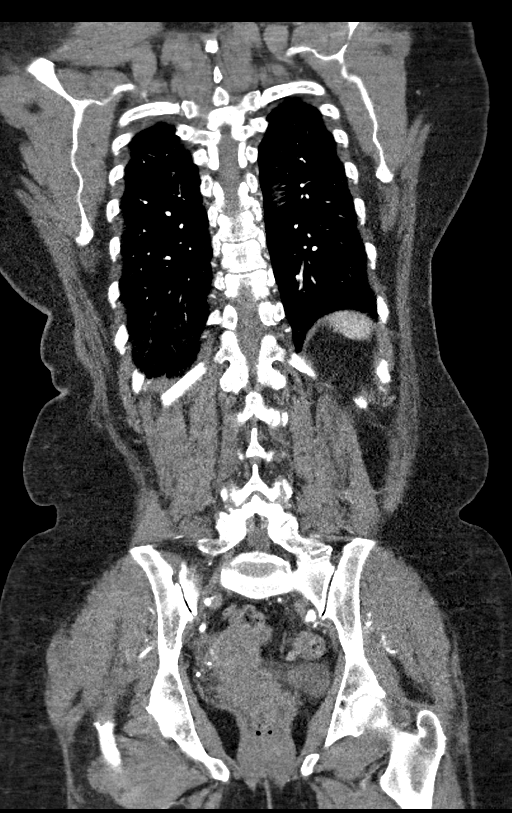

[15 of 46 positions shown; findings below may reference images not displayed]

Multidetector CT imaging through the chest, abdomen and pelvis was
performed using the standard protocol during bolus administration of
intravenous contrast. Multiplanar reconstructed images and MIPs were
obtained and reviewed to evaluate the vascular anatomy.

CONTRAST:  100mL OMNIPAQUE IOHEXOL 350 MG/ML SOLN
FINDINGS: CTA CHEST FINDINGS

Cardiovascular: There is no evidence of aortic intramural hematoma
or dissection. The thoracic aorta is normal in caliber. Normal heart
size. No pericardial effusion.

Mediastinum/Nodes: There are no enlarged lymph nodes. Thyroid is
unremarkable.

Lungs/Pleura: Mild bibasilar scarring/atelectasis.

Musculoskeletal: No acute osseous abnormality.

Review of the MIP images confirms the above findings.

CTA ABDOMEN AND PELVIS FINDINGS

VASCULAR

Aorta: Normal caliber aorta without aneurysm, dissection, vasculitis
or significant stenosis. Mild calcified plaque.

Celiac: Patent without stenosis.

SMA: Patent without stenosis

Renals: Patent with minimal calcified plaque at the origins.

IMA: Patent without stenosis.

Inflow: Patent without stenosis.

Veins: Poorly evaluated on this study.

Review of the MIP images confirms the above findings.

NON-VASCULAR

Hepatobiliary: Hepatic steatosis.  Gallbladder is unremarkable.

Pancreas: Unremarkable.

Spleen: Unremarkable.

Adrenals/Urinary Tract: Adrenals, kidneys, and bladder are
unremarkable.

Stomach/Bowel: Stomach is within normal limits. Bowel is normal in
caliber. Mild sigmoid diverticulosis. Normal appendix.

Lymphatic: No enlarged lymph nodes identified.

Reproductive: Uterus and bilateral adnexa are unremarkable.

Other: No ascites. No significant abnormality of the abdominal wall.

Musculoskeletal: No acute osseous abnormality.

Review of the MIP images confirms the above findings.
IMPRESSION: No evidence of aortic dissection or other acute abnormality.

Hepatic steatosis.

Mild colonic diverticulosis.

Mild aortic atherosclerosis.

## 2022-06-13 ENCOUNTER — Encounter (HOSPITAL_COMMUNITY): Payer: Self-pay

## 2022-06-13 ENCOUNTER — Emergency Department (HOSPITAL_COMMUNITY): Payer: PRIVATE HEALTH INSURANCE

## 2022-06-13 ENCOUNTER — Other Ambulatory Visit: Payer: Self-pay

## 2022-06-13 ENCOUNTER — Emergency Department (HOSPITAL_COMMUNITY)
Admission: EM | Admit: 2022-06-13 | Discharge: 2022-06-13 | Disposition: A | Discharge status: Home / Self Care | Payer: PRIVATE HEALTH INSURANCE | Attending: Emergency Medicine | Admitting: Emergency Medicine

## 2022-06-13 DIAGNOSIS — M1711 Unilateral primary osteoarthritis, right knee: Secondary | ICD-10-CM | POA: Insufficient documentation

## 2022-06-13 DIAGNOSIS — M25561 Pain in right knee: Secondary | ICD-10-CM | POA: Diagnosis present

## 2022-06-13 DIAGNOSIS — M1990 Unspecified osteoarthritis, unspecified site: Secondary | ICD-10-CM

## 2022-06-13 MED ORDER — IBUPROFEN 400 MG PO TABS
600.0000 mg | ORAL_TABLET | Freq: Once | ORAL | Status: AC
  Administered 2022-06-13: 600 mg via ORAL
  Filled 2022-06-13: qty 2

## 2022-06-13 NOTE — ED Notes (Signed)
Patient complains of pain in her right knee and some swelling below the knee.

## 2022-06-13 NOTE — ED Triage Notes (Addendum)
Pt in R knee pain & leg swelling onset yesterday, denies injury to the area, pt ambulatory with pain, hx osteoarthitis, A&O x4

## 2022-06-13 NOTE — Discharge Instructions (Signed)
Evaluation today revealed that you do have arthritis in the right knee.  Otherwise x-ray was reassuring.  There was no fracture or dislocation.  Recommend that you do follow-up with orthopedics as you may have a ligament injury of some kind given that you felt a pop in your knee.  I provided Dr. Dorothyann Peng Harrison's contact information in your discharge summary.  Please schedule follow-up appointment within the next week.  In the meantime recommend that you use knee brace while ambulating.

## 2022-06-13 NOTE — ED Provider Notes (Signed)
[facility] Provider Note   CSN: [id number] Arrival date & time: 06/13/22  0732     History  Chief Complaint  Patient presents with   Knee Pain   HPI Amanda Doyle is a 67 y.o. female presenting for right knee pain.  Symptoms started yesterday.  States she felt a "pop" in her knee a couple days prior.  States she still able to bear weight but it is painful at times.  Noticed some swelling this morning around her right knee.  Mention that she does have history of osteoarthritis in that knee as well.  Denies recent trauma.   Knee Pain      Home Medications Prior to Admission medications   Medication Sig Start Date End Date Taking? Authorizing Provider  amLODipine (NORVASC) 10 MG tablet Take 10 mg by mouth daily. 06/28/19   [provider]  Cholecalciferol (VITAMIN D3) 50 MCG (2000 UT) TABS Take by mouth.    [provider]  hydrochlorothiazide (HYDRODIURIL) 25 MG tablet Take 25 mg by mouth every morning.    [provider]  pravastatin (PRAVACHOL) 40 MG tablet Take 40 mg by mouth at bedtime. 06/28/19   [provider]      Allergies    Shellfish allergy    Review of Systems   Review of Systems  Musculoskeletal:        Right knee pain and swelling    Physical Exam Updated Vital Signs BP 130/69   Pulse 61   Temp 97.9 F (36.6 C) (Oral)   Resp 16   Ht 5\' 3"  (1.6 m)   Wt 88.6 kg   SpO2 98%   BMI 34.60 kg/m  Physical Exam Constitutional:      Appearance: Normal appearance.  HENT:     Head: Normocephalic.     Nose: Nose normal.  Eyes:     Conjunctiva/sclera: Conjunctivae normal.  Pulmonary:     Effort: Pulmonary effort is normal.  Musculoskeletal:     Right knee: Normal. No swelling, deformity, effusion or erythema. Normal range of motion. No tenderness.     Left knee: Normal.     Comments: Gait is normal.   Neurological:     Mental Status: She is alert.  Psychiatric:         Mood and Affect: Mood normal.     ED Results / Procedures / Treatments   Labs (all labs ordered are listed, but only abnormal results are displayed) Labs Reviewed - No data to display  EKG None  Radiology DG Knee Complete 4 Views Right  Result Date: 06/13/2022 CLINICAL DATA:  Pain and swelling. EXAM: RIGHT KNEE - COMPLETE 4+ VIEW COMPARISON:  None Available. FINDINGS: No evidence of fracture, dislocation, or joint effusion. Mild medial tibiofemoral and patellofemoral joint space narrowing. Soft tissues are unremarkable. IMPRESSION: No acute fracture or dislocation. Mild medial tibiofemoral and patellofemoral joint space narrowing suggesting mild arthritis. Electronically Signed   By: Keane Police D.O.   On: 06/13/2022 13:06    Procedures Procedures    Medications Ordered in ED Medications  ibuprofen (ADVIL) tablet 600 mg (600 mg Oral Given 06/13/22 1257)    ED Course/ Medical Decision Making/ A&P                             Medical Decision Making Amount and/or Complexity of Data Reviewed Radiology: ordered.  Risk Prescription drug management.  67 year old female who is well-appearing presenting for right knee pain.  Exam was unremarkable.  DDx includes ligamentous injury, fracture, dislocation, septic arthritis, joint effusion.  Personally reviewed and interpreted x-ray which did reveal concern for mild medial tibiofemoral and patellofemoral joint space narrowing.  This is concerning for mild arthritis.  Patient ambulated without complication.  Given her sensation of "popping", there is some concern for ligament involvement.  Applied knee brace and crutches.  Advised her to follow-up with Dr. Arther Abbott of orthopedics for follow-up.  Discussed return precautions.        Final Clinical Impression(s) / ED Diagnoses Final diagnoses:  Arthritis    Rx / DC Orders ED Discharge Orders     None         Harriet Pho, PA-C 06/13/22 1316    Milton Ferguson, MD 06/13/22 1826

## 2022-06-18 ENCOUNTER — Ambulatory Visit (INDEPENDENT_AMBULATORY_CARE_PROVIDER_SITE_OTHER): Payer: PRIVATE HEALTH INSURANCE | Admitting: Orthopedic Surgery

## 2022-06-18 ENCOUNTER — Encounter: Payer: Self-pay | Admitting: Orthopedic Surgery

## 2022-06-18 VITALS — BP 127/87 | HR 74 | Ht 63.0 in | Wt 192.8 lb

## 2022-06-18 DIAGNOSIS — M25561 Pain in right knee: Secondary | ICD-10-CM

## 2022-06-18 NOTE — Patient Instructions (Signed)
Physical therapy for the right knee  Continue with medications as needed.  Consider topical treatments, including Voltaren gel, Biofreeze spray and patches.  Brace  If therapy is helpful, transition back into exercise regimen  If still having issues, please contact clinic.

## 2022-06-18 NOTE — Progress Notes (Signed)
New Patient Visit  Assessment: Amanda Doyle is a 67 y.o. female with the following: 1. Acute pain of right knee  Plan: NICKI KAUFER has pain in the posterior aspect of her right knee, that has been ongoing for the past week.  She states she stepped up a step, and noted a popping sensation in the posterior aspect of her knee.  Since then, she noted swelling.  The swelling has improved.  She has been wearing a brace.  She has been taking medications as needed.  Radiographs demonstrate some mild arthritis.  Crepitus is consistent with mild arthritis.  We discussed multiple treatment options, and she would like to proceed with physical therapy.  If she continues to have issues, she will return to clinic.  Follow-up: Return if symptoms worsen or fail to improve.  Subjective:  Chief Complaint  Patient presents with   Knee Pain    Rt knee pain and swelling. Pt states she felt a pop when she stepped up on a short step, didn't bother her much but a few days later she had significant pain/swelling     History of Present Illness: Amanda Doyle is a 67 y.o. female who presents for evaluation of right knee pain.  She has had pain in the posterior aspect of the right knee for the past week.  She states that she stepped up, and noted a popping sensation in the posterior aspect of her knee.  This was followed by swelling.  The swelling is improved.  Her pain is better.  She has been wearing a brace.  She is taking medications occasionally.  She has not worked with physical therapy.  She has not had an injection.   Review of Systems: No fevers or chills No numbness or tingling No chest pain No shortness of breath No bowel or bladder dysfunction No GI distress No headaches   Medical History:  Past Medical History:  Diagnosis Date   Arthritis    knee    Bell palsy    Cataract    bilateral lens implants   Hypercholesteremia    Hypertension     Past Surgical History:  Procedure  Laterality Date   CHOLECYSTECTOMY      Family History  Problem Relation Age of Onset   Breast cancer Paternal Aunt        3 aunts, ages 35, 50, 55   Colon cancer Neg Hx    Colon polyps Neg Hx    Esophageal cancer Neg Hx    Rectal cancer Neg Hx    Stomach cancer Neg Hx    Social History   Tobacco Use   Smoking status: Former    Types: Cigarettes    Quit date: 01/2020    Years since quitting: 2.3   Smokeless tobacco: Never  Substance Use Topics   Alcohol use: Yes    Comment: occ   Drug use: No    Allergies  Allergen Reactions   Shellfish Allergy Anaphylaxis    Current Meds  Medication Sig   amLODipine (NORVASC) 10 MG tablet Take 10 mg by mouth daily.   hydrochlorothiazide (HYDRODIURIL) 25 MG tablet Take 25 mg by mouth every morning.   Multiple Vitamins-Minerals (PRESERVISION AREDS PO) Take by mouth.   pravastatin (PRAVACHOL) 40 MG tablet Take 40 mg by mouth at bedtime.    Objective: BP 127/87   Pulse 74   Ht 5\' 3"  (1.6 m)   Wt 192 lb 12.8 oz (87.5 kg)   BMI  34.15 kg/m   Physical Exam:  General: Alert and oriented. and No acute distress. Gait: Right sided antalgic gait.  Right knee without swelling.  Mild crepitus appreciated with range of motion testing.  Negative Lachman.  No increased laxity varus valgus stress.  She has some tenderness to palpation over the anterior and lateral aspect of the knee.  No tenderness to palpation along the medial or lateral joint line.  No Baker's cyst.  IMAGING: I personally reviewed images previously obtained from the ED  X-rays the right knee were obtained in the emergency department.  No acute injuries.  Minimal degenerative changes.  Minimal osteophytes.  Mild arthritis overall   New Medications:  No orders of the defined types were placed in this encounter.     Mordecai Rasmussen, MD  06/18/2022 10:21 AM
